# Patient Record
Sex: Male | Born: 1975 | Race: Black or African American | Hispanic: No | Marital: Single | Smoking: Current every day smoker
Health system: Southern US, Community
[De-identification: ages and names within clinical notes are randomized; demographics above are authoritative.]

---

## 2005-02-21 ENCOUNTER — Emergency Department: Payer: Self-pay | Admitting: Emergency Medicine

## 2010-01-12 ENCOUNTER — Emergency Department: Payer: Self-pay | Admitting: Emergency Medicine

## 2010-11-10 ENCOUNTER — Emergency Department: Payer: Self-pay | Admitting: Emergency Medicine

## 2013-08-03 ENCOUNTER — Emergency Department: Payer: Self-pay | Admitting: Emergency Medicine

## 2015-05-01 ENCOUNTER — Encounter: Payer: Self-pay | Admitting: Emergency Medicine

## 2015-05-01 ENCOUNTER — Emergency Department
Admission: EM | Admit: 2015-05-01 | Discharge: 2015-05-01 | Disposition: A | Discharge status: Home / Self Care | Payer: Self-pay | Attending: Emergency Medicine | Admitting: Emergency Medicine

## 2015-05-01 DIAGNOSIS — F17210 Nicotine dependence, cigarettes, uncomplicated: Secondary | ICD-10-CM | POA: Insufficient documentation

## 2015-05-01 DIAGNOSIS — K047 Periapical abscess without sinus: Secondary | ICD-10-CM | POA: Insufficient documentation

## 2015-05-01 MED ORDER — AMOXICILLIN 500 MG PO CAPS: 500.0000 mg | ORAL_CAPSULE | Freq: Three times a day (TID) | ORAL | Status: AC

## 2015-05-01 MED ORDER — IBUPROFEN 800 MG PO TABS: 800.0000 mg | ORAL_TABLET | Freq: Three times a day (TID) | ORAL | Status: AC

## 2015-05-01 NOTE — ED Notes (Signed)
Dental pain L side upper began yesterday.

## 2015-05-01 NOTE — ED Notes (Signed)
Pt c/o pain in his gums x 2 days. Pt presents to ED with swelling noted to L side of his face. Pt states he knows he has a "bad tooth" but has not been able to see a dentist. Pt states pain 10/10 throbbing in his L gums at this time. Pt able to maintain his secretions at this time.

## 2015-05-01 NOTE — ED Provider Notes (Signed)
[facility] Emergency Department Provider Note  ____________________________________________  Time seen: Approximately 2:56 PM  I have reviewed the triage vital signs and the nursing notes.   HISTORY  Chief Complaint Dental Pain   HPI Barry Meadows. is a 40 y.o. male with complaint of pain to his gum left upper area for 2 days. Patient states he knows he has a "bad tooth". He has not been able to see a dentist. He rates his pain as a 10 out of 10. He is unaware of any fever or chills.  History reviewed. No pertinent past medical history.  There are no active problems to display for this patient.   History reviewed. No pertinent past surgical history.  Current Outpatient Rx  Name  Route  Sig  Dispense  Refill  . amoxicillin (AMOXIL) 500 MG capsule   Oral   Take 1 capsule (500 mg total) by mouth 3 (three) times daily.   30 capsule   0   . ibuprofen (ADVIL,MOTRIN) 800 MG tablet   Oral   Take 1 tablet (800 mg total) by mouth 3 (three) times daily.   30 tablet   0     Allergies Review of patient's allergies indicates no known allergies.  No family history on file.  Social History Social History  Substance Use Topics  . Smoking status: Current Every Day Smoker -- 1.00 packs/day    Types: Cigarettes  . Smokeless tobacco: None  . Alcohol Use: Yes    Review of Systems Constitutional: No fever/chills ENT: Positive dental pain Cardiovascular: Denies chest pain. Respiratory: Denies shortness of breath. Gastrointestinal: No nausea, no vomiting.  Skin: Negative for rash. Neurological: Negative for headaches, focal weakness or numbness.  10-point ROS otherwise negative.  ____________________________________________   PHYSICAL EXAM:  VITAL SIGNS: ED Triage Vitals  Enc Vitals Group     BP 05/01/15 1315 120/79 mmHg     Pulse Rate 05/01/15 1315 87     Resp 05/01/15 1315 18     Temp 05/01/15 1315 98.5 F (36.9 C)     Temp Source  05/01/15 1315 Oral     SpO2 05/01/15 1315 97 %     Weight 05/01/15 1315 205 lb (92.987 kg)     Height 05/01/15 1315  (1.905 m)     Head Cir --      Peak Flow --      Pain Score 05/01/15 1317 10     Pain Loc --      Pain Edu? --      Excl. in GC? --     Constitutional: Alert and oriented. Well appearing and in no acute distress. Eyes: Conjunctivae are normal. PERRL. EOMI. Head: Atraumatic. Nose: No congestion/rhinnorhea. Mouth/Throat: Mucous membranes are moist.  Oropharynx non-erythematous. Moderate tenderness on palpation with a tongue depressor the gums on the left upper molars. There is some external facial edema present. No obvious abscess seen. Neck: No stridor.   Hematological/Lymphatic/Immunilogical: No cervical lymphadenopathy. Cardiovascular: Normal rate, regular rhythm. Grossly normal heart sounds.  Good peripheral circulation. Respiratory: Normal respiratory effort.  No retractions. Lungs CTAB. Gastrointestinal: Soft and nontender. No distention.  Musculoskeletal: Moves upper and lower extremities without any difficulty. Normal gait was noted. Neurologic:  Normal speech and language. No gross focal neurologic deficits are appreciated. No gait instability. Skin:  Skin is warm, dry and intact. No rash noted. Psychiatric: Mood and affect are normal. Speech and behavior are normal.  ____________________________________________   LABS (all labs  ordered are listed, but only abnormal results are displayed)  Labs Reviewed - No data to display  PROCEDURES  Procedure(s) performed: None  Critical Care performed: No  ____________________________________________   INITIAL IMPRESSION / ASSESSMENT AND PLAN / ED COURSE  Pertinent labs & imaging results that were available during my care of the patient were reviewed by me and considered in my medical decision making (see chart for details).  Patient started him on some 500 mg 3 times a day for 10 days along with  ibuprofen as needed for pain. Patient was given a list of dental clinics to follow up with and was encouraged to do so. ____________________________________________   FINAL CLINICAL IMPRESSION(S) / ED DIAGNOSES  Final diagnoses:  Dental abscess      Tommi Rumps, PA-C 05/01/15 2239  Jene Every, MD 05/01/15 [phone]

## 2015-05-01 NOTE — Discharge Instructions (Signed)
Dental Abscess °A dental abscess is pus in or around a tooth. °HOME CARE °· Take medicines only as told by your dentist. °· If you were prescribed antibiotic medicine, finish all of it even if you start to feel better. °· Rinse your mouth (gargle) often with salt water. °· Do not drive or use heavy machinery, like a lawn mower, while taking pain medicine. °· Do not apply heat to the outside of your mouth. °· Keep all follow-up visits as told by your dentist. This is important. °GET HELP IF: °· Your pain is worse, and medicine does not help. °GET HELP RIGHT AWAY IF: °· You have a fever or chills. °· Your symptoms suddenly get worse. °· You have a very bad headache. °· You have problems breathing or swallowing. °· You have trouble opening your mouth. °· You have puffiness (swelling) in your neck or around your eye. °  °This information is not intended to replace advice given to you by your health care provider. Make sure you discuss any questions you have with your health care provider. °  °Document Released: 07/14/2014 Document Reviewed: 07/14/2014 °Elsevier Interactive Patient Education ©2016 Elsevier Inc[employer] ° ° °OPTIONS FOR DENTAL FOLLOW UP CARE ° °[place] Department of Health and Human Services - Local Safety Net Dental Clinics °http://www.ncdhhs.gov/dph/oralhealth/services/safetynetclinics.htm °  °[facility] (336-562-3123) ° °[facility] [place] (919-933-9087) ° °[facility] (919-663-1744 ext [phone]) ° °[facility] ([phone]) ° °[facility] ([phone]) °This clinic caters to the indigent population and is on a lottery system. °Location: °[facility], [address], [place] °Clinic Hours: °Wednesdays from 6pm - 9pm, patients seen by a lottery system. °For dates, call or go to www.med.[facility].edu/[facility]/patients/Dental-[facility] °Services: °Cleanings, fillings and simple extractions. °Payment Options: °DENTAL WORK IS FREE OF CHARGE. Bring proof  of income or support. °Best way to get seen: °Arrive at 5:15 pm - this is a lottery, NOT first come/first serve, so arriving earlier will not increase your chances of being seen. °  °  °[facility] °[phone] °Select option 1 for emergencies °  °Location: °[facility], [address], [place] °Clinic Hours: °No walk-ins accepted - call the day before to schedule an appointment. °Check in times are 9:30 am and 1:30 pm. °Services: °Simple extractions, temporary fillings, pulpectomy/pulp debridement, uncomplicated abscess drainage. °Payment Options: °PAYMENT IS DUE AT THE TIME OF SERVICE.  Fee is usually $100-200, additional surgical procedures (e.g. abscess drainage) may be extra. °Cash, checks, Visa/MasterCard accepted.  Can file Medicaid if patient is covered for dental - patient should call case worker to check. °No discount for [facility] patients. °Best way to get seen: °MUST call the day before and get onto the schedule. Can usually be seen the next 1-2 days. No walk-ins accepted. °  °  °[facility] °[phone] °  °Location: °[facility], [address], [place] °Clinic Hours: °M, W, Th, F 8am or 1:30pm, Tues 9a or 1:30 - first come/first served. °Services: °Simple extractions, temporary fillings, uncomplicated abscess drainage.  You do not need to be an [place] resident. °Payment Options: °PAYMENT IS DUE AT THE TIME OF SERVICE. °Dental insurance, otherwise sliding scale - bring proof of income or support. °Depending on income and treatment needed, cost is usually $50-200. °Best way to get seen: °Arrive early as it is first come/first served. °  °  °[facility] °[phone] °  °  [phone]   Location: [address] Clinic Hours: Mon-Thu 8a-5p Services: Most basic dental services including extractions and fillings. Payment Options: PAYMENT IS DUE AT THE TIME  OF SERVICE. Sliding scale, up to 50% off - bring proof if income or support. Medicaid with dental option accepted. Best way to get seen: Call to schedule an appointment, can usually be seen within 2 weeks OR they will try to see walk-ins - show up at 8a or 2p (you may have to wait).     [facility] [phone] [place] RESIDENTS ONLY   Location: [facility], [address], [place], [place] [address] Clinic Hours: By appointment only. Monday - Thursday 8am-5pm, Friday 8am-12pm Services: Cleanings, fillings, extractions. Payment Options: PAYMENT IS DUE AT THE TIME OF SERVICE. Cash, Visa or MasterCard. Sliding scale - $30 minimum per service. Best way to get seen: Come in to office, complete packet and make an appointment - need proof of income or support monies for each household member and proof of [employer] residence. Usually takes about a month to get in.     [facility] [phone]   Location: [address] [facility] Clinic Hours: Walk-in Urgent Care Dental Services are offered Monday-Friday mornings only. The numbers of emergencies accepted daily is limited to the number of providers available. Maximum 15 - Mondays, Wednesdays & Thursdays Maximum 10 - Tuesdays & Fridays Services: You do not need to be a [facility] resident to be seen for a dental emergency. Emergencies are defined as pain, swelling, abnormal bleeding, or dental trauma. Walkins will receive x-rays if needed. NOTE: Dental cleaning is not an emergency. Payment Options: PAYMENT IS DUE AT THE TIME OF SERVICE. Minimum co-pay is $40.00 for uninsured patients. Minimum co-pay is $3.00 for Medicaid with dental coverage. Dental Insurance is accepted and must be presented at time of visit. Medicare does not cover dental. Forms of payment: Cash, credit card, checks. Best way to get seen: If not previously registered with the clinic,  walk-in dental registration begins at 7:15 am and is on a first come/first serve basis. If previously registered with the clinic, call to make an appointment.     [facility] [phone] [place] RESIDENTS ONLY   Location: [address] [place], [place] Clinic Hours: Mon-Thu 10a-2p Services: Extractions only! Payment Options: FREE (donations accepted) - bring proof of income or support Best way to get seen: Call and schedule an appointment OR come at 8am on the 1st Monday of every month (except for holidays) when it is first come/first served.     [facility] [phone]   Location: [address] [place], [place] Clinic Hours: Friday mornings Services, Payment Options, Best way to get seen: Call for info   You need to see a dentist listed on this paper or obtain your own dentist. The dental clinic at [facility] sees walk-in patients as well as appointments. Please give them a phone call. Begin taking antibiotics today and ibuprofen with food. You may also take Tylenol for breakthrough pain if needed.

## 2018-07-17 ENCOUNTER — Emergency Department
Admission: EM | Admit: 2018-07-17 | Discharge: 2018-07-17 | Disposition: A | Discharge status: Home / Self Care | Payer: Self-pay | Attending: Emergency Medicine | Admitting: Emergency Medicine

## 2018-07-17 ENCOUNTER — Other Ambulatory Visit: Payer: Self-pay

## 2018-07-17 ENCOUNTER — Encounter: Payer: Self-pay | Admitting: Emergency Medicine

## 2018-07-17 ENCOUNTER — Emergency Department: Payer: Self-pay

## 2018-07-17 DIAGNOSIS — Y999 Unspecified external cause status: Secondary | ICD-10-CM | POA: Insufficient documentation

## 2018-07-17 DIAGNOSIS — Y929 Unspecified place or not applicable: Secondary | ICD-10-CM | POA: Insufficient documentation

## 2018-07-17 DIAGNOSIS — S0083XA Contusion of other part of head, initial encounter: Secondary | ICD-10-CM | POA: Insufficient documentation

## 2018-07-17 DIAGNOSIS — Y9389 Activity, other specified: Secondary | ICD-10-CM | POA: Insufficient documentation

## 2018-07-17 DIAGNOSIS — S0181XA Laceration without foreign body of other part of head, initial encounter: Secondary | ICD-10-CM | POA: Insufficient documentation

## 2018-07-17 DIAGNOSIS — F17210 Nicotine dependence, cigarettes, uncomplicated: Secondary | ICD-10-CM | POA: Insufficient documentation

## 2018-07-17 DIAGNOSIS — S60221A Contusion of right hand, initial encounter: Secondary | ICD-10-CM | POA: Insufficient documentation

## 2018-07-17 DIAGNOSIS — Z23 Encounter for immunization: Secondary | ICD-10-CM | POA: Insufficient documentation

## 2018-07-17 MED ORDER — LIDOCAINE HCL (PF) 1 % IJ SOLN
5.0000 mL | Freq: Once | INTRAMUSCULAR | Status: AC
  Administered 2018-07-17: 5 mL
  Filled 2018-07-17: qty 5

## 2018-07-17 MED ORDER — TETANUS-DIPHTH-ACELL PERTUSSIS 5-2.5-18.5 LF-MCG/0.5 IM SUSP
0.5000 mL | Freq: Once | INTRAMUSCULAR | Status: AC
  Administered 2018-07-17: 05:00:00 0.5 mL via INTRAMUSCULAR
  Filled 2018-07-17: qty 0.5
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
## 2019-07-03 ENCOUNTER — Ambulatory Visit: Payer: Self-pay | Attending: Internal Medicine

## 2019-07-03 DIAGNOSIS — Z23 Encounter for immunization: Secondary | ICD-10-CM

## 2019-07-03 NOTE — Progress Notes (Signed)
   [id number]  Name:  Barry Meadows.    MRN: [id number] DOB: 07/22/1975  07/03/2019  Barry Meadows was observed post Covid-19 immunization for 15 minutes without incident. He was provided with Vaccine Information Sheet and instruction to access the V-Safe system.   Barry Meadows was instructed to call 911 with any severe reactions post vaccine: Marland Kitchen Difficulty breathing  . Swelling of face and throat  . A fast heartbeat  . A bad rash all over body  . Dizziness and weakness   Immunizations Administered    Name Date Dose VIS Date Route   Pfizer COVID-19 Vaccine 07/03/2019 10:01 AM 0.3 mL 05/07/2018 Intramuscular   Manufacturer: [employer], [employer]   Lot: [id number]   NDC: [address]-9

## 2019-07-30 ENCOUNTER — Ambulatory Visit: Payer: Self-pay | Attending: Internal Medicine

## 2020-02-23 ENCOUNTER — Other Ambulatory Visit: Payer: Self-pay

## 2020-02-23 ENCOUNTER — Encounter: Payer: Self-pay | Admitting: Physician Assistant

## 2020-02-23 ENCOUNTER — Ambulatory Visit: Payer: Self-pay | Admitting: Physician Assistant

## 2020-02-23 DIAGNOSIS — Z113 Encounter for screening for infections with a predominantly sexual mode of transmission: Secondary | ICD-10-CM

## 2020-02-23 DIAGNOSIS — A5401 Gonococcal cystitis and urethritis, unspecified: Secondary | ICD-10-CM

## 2020-02-23 LAB — GRAM STAIN

## 2020-02-23 MED ORDER — DOXYCYCLINE HYCLATE 100 MG PO TABS: 100.0000 mg | ORAL_TABLET | Freq: Two times a day (BID) | ORAL | 0 refills | Status: AC

## 2020-02-23 MED ORDER — CEFTRIAXONE SODIUM 500 MG IJ SOLR
500.0000 mg | Freq: Once | INTRAMUSCULAR | Status: AC
  Administered 2020-02-23: 500 mg via INTRAMUSCULAR

## 2020-02-23 NOTE — Progress Notes (Signed)
[facility] Department STI clinic/screening visit  Subjective:  Barry Meadows. is a 44 y.o. male being seen today for an STI screening visit. The patient reports they do have symptoms.    Patient has the following medical conditions:  There are no problems to display for this patient.    Chief Complaint  Patient presents with  . SEXUALLY TRANSMITTED DISEASE    screening    HPI  Patient reports that he has had a white discharge for about 2 weeks.  Also, reports that he "passed something" and then had some blood in his urine with dysuria.  Denies chronic conditions, surgeries and regular medicines.  States last HIV test was over 3 years ago and last void prior to sample collection for Gram stain was less than 2 hr ago.   See flowsheet for further details and programmatic requirements.    The following portions of the patient's history were reviewed and updated as appropriate: allergies, current medications, past medical history, past social history, past surgical history and problem list.  Objective:  There were no vitals filed for this visit.  Physical Exam Constitutional:      General: He is not in acute distress.    Appearance: Normal appearance.  HENT:     Head: Normocephalic and atraumatic.     Comments: No nits,lice, or hair loss. No cervical, supraclavicular or axillary adenopathy.    Mouth/Throat:     Mouth: Mucous membranes are moist.     Pharynx: Oropharynx is clear. No oropharyngeal exudate or posterior oropharyngeal erythema.  Eyes:     Conjunctiva/sclera: Conjunctivae normal.  Pulmonary:     Effort: Pulmonary effort is normal.  Abdominal:     Palpations: Abdomen is soft. There is no mass.     Tenderness: There is no abdominal tenderness. There is no guarding or rebound.  Genitourinary:    Penis: Normal.      Testes: Normal.     Comments: Pubic area without nits, lice, hair loss, edema, erythema, lesions and inguinal adenopathy. Penis  circumcised without rash or lesions. Moderate amount of yellowish discharge at meatus. Musculoskeletal:     Cervical back: Neck supple. No tenderness.  Skin:    General: Skin is warm and dry.     Findings: No bruising, erythema, lesion or rash.  Neurological:     Mental Status: He is alert and oriented to person, place, and time.  Psychiatric:        Mood and Affect: Mood normal.        Behavior: Behavior normal.        Thought Content: Thought content normal.        Judgment: Judgment normal.       Assessment and Plan:  Barry Meadows. is a 44 y.o. male presenting to the [facility] Department for STI screening  1. Screening for STD (sexually transmitted disease) Patient into clinic with symptoms. Rec condoms with all sex. Await test results.  Counseled that RN will call if needs to RTC for treatment once results are back. - Gram stain - HBV Antigen/Antibody [facility] - HIV/HCV [place] [facility] - Syphilis Serology,  Lab  2. Gonococcal urethritis in male Will treat for GC and cover for Chlamydia with Ceftriaxone 500 mg IM and Doxycycline 100 mg #14 1 po BID for 7 days. No sex for 14 days and until after partner/s complete treatment. Call with questions or concerns.  - cefTRIAXone (ROCEPHIN) injection 500 mg -  doxycycline (VIBRA-TABS) 100 MG tablet; Take 1 tablet (100 mg total) by mouth 2 (two) times daily.  Dispense: 14 tablet; Refill: 0     Return if symptoms worsen or fail to improve; ~3 months for TOC.  No future appointments.  Matt Holmes, PA

## 2020-02-23 NOTE — Progress Notes (Signed)
Gram stain reviewed and pt treated for Gonorrhea per Sadie Haber, PA order with Ceftriaxone 500mg  IM, and received Doxycycline 100mg  #14, 1 tab po BID x 7 days. Pt tolerated injection well. Counseled pt per provider orders and pt states understanding. Provider orders completed.

## 2020-02-26 LAB — HM HIV SCREENING LAB: HM HIV Screening: NEGATIVE

## 2020-02-26 LAB — HM HEPATITIS C SCREENING LAB: HM Hepatitis Screen: NEGATIVE

## 2020-02-27 LAB — HEPATITIS B SURFACE ANTIGEN: Hepatitis B Surface Ag: NEGATIVE
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
